# Patient Record
Sex: Male | Born: 1965 | Race: Black or African American | Hispanic: No | Marital: Single | Smoking: Never smoker
Health system: Southern US, Community
[De-identification: ages and names within clinical notes are randomized; demographics above are authoritative.]

---

## 2006-10-10 ENCOUNTER — Emergency Department (HOSPITAL_COMMUNITY): Admission: EM | Admit: 2006-10-10 | Discharge: 2006-10-10 | Payer: Self-pay | Admitting: Emergency Medicine

## 2006-10-14 ENCOUNTER — Emergency Department (HOSPITAL_COMMUNITY): Admission: EM | Admit: 2006-10-14 | Discharge: 2006-10-14 | Payer: Self-pay | Admitting: Emergency Medicine

## 2006-11-28 ENCOUNTER — Ambulatory Visit (HOSPITAL_COMMUNITY): Admission: RE | Admit: 2006-11-28 | Discharge: 2006-11-28 | Payer: Self-pay | Admitting: Gastroenterology

## 2006-11-29 ENCOUNTER — Encounter: Admission: RE | Admit: 2006-11-29 | Discharge: 2006-11-29 | Payer: Self-pay | Admitting: Gastroenterology

## 2006-12-09 ENCOUNTER — Encounter: Admission: RE | Admit: 2006-12-09 | Discharge: 2006-12-09 | Payer: Self-pay | Admitting: Internal Medicine

## 2007-04-27 ENCOUNTER — Encounter: Admission: RE | Admit: 2007-04-27 | Discharge: 2007-04-27 | Payer: Self-pay | Admitting: General Practice

## 2007-05-01 ENCOUNTER — Encounter: Admission: RE | Admit: 2007-05-01 | Discharge: 2007-06-11 | Payer: Self-pay | Admitting: General Practice

## 2009-03-28 IMAGING — CR DG CERVICAL SPINE 2 OR 3 VIEWS
3 series · 3 of 3 positions shown · non-contrast
Comparison: None.

CLINICAL DATA: No injury.  Posterior neck pain.  
CERVICAL SPINE ? 3 VIEW:

[w c-spine lat]
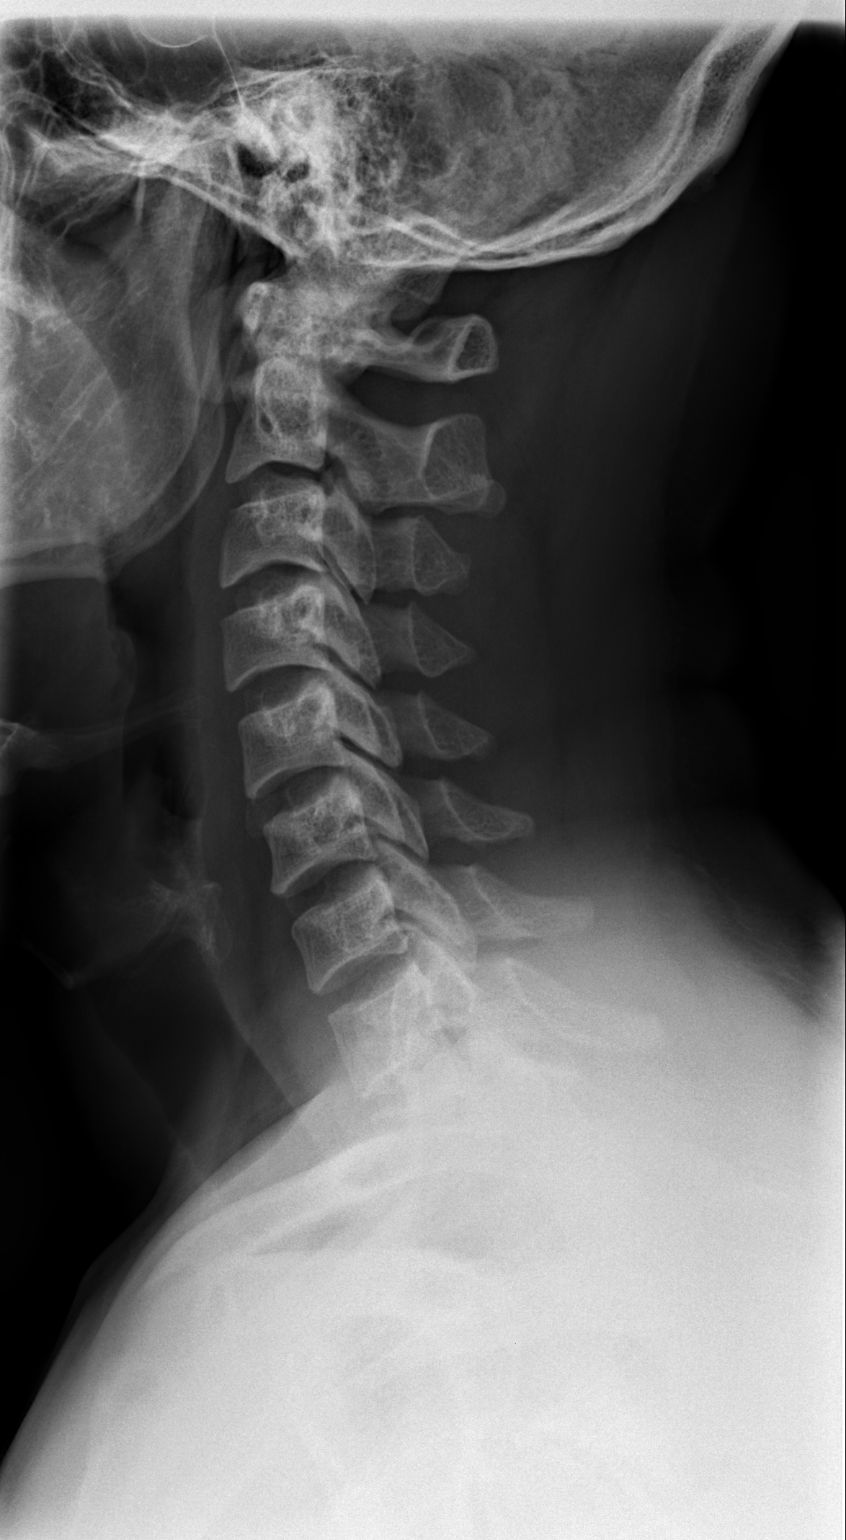

[w c-spine a.p.]
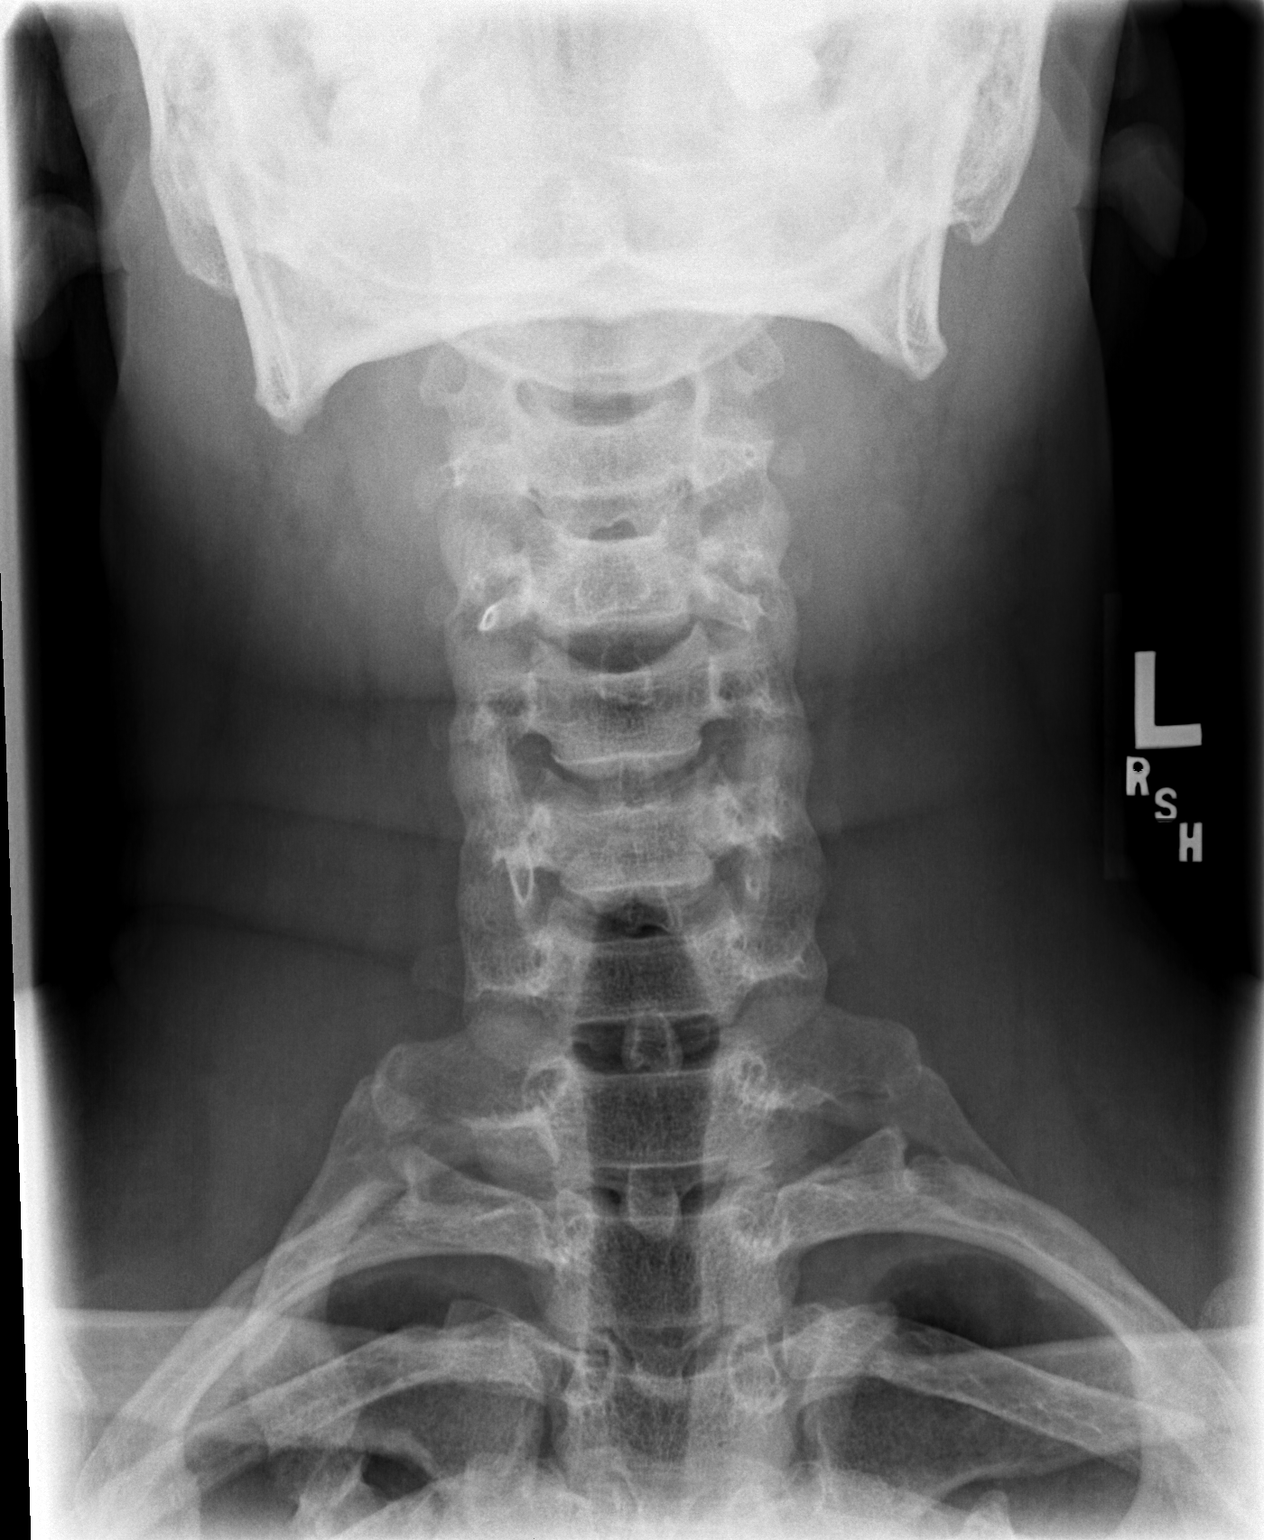

[w c-spine odontoid]
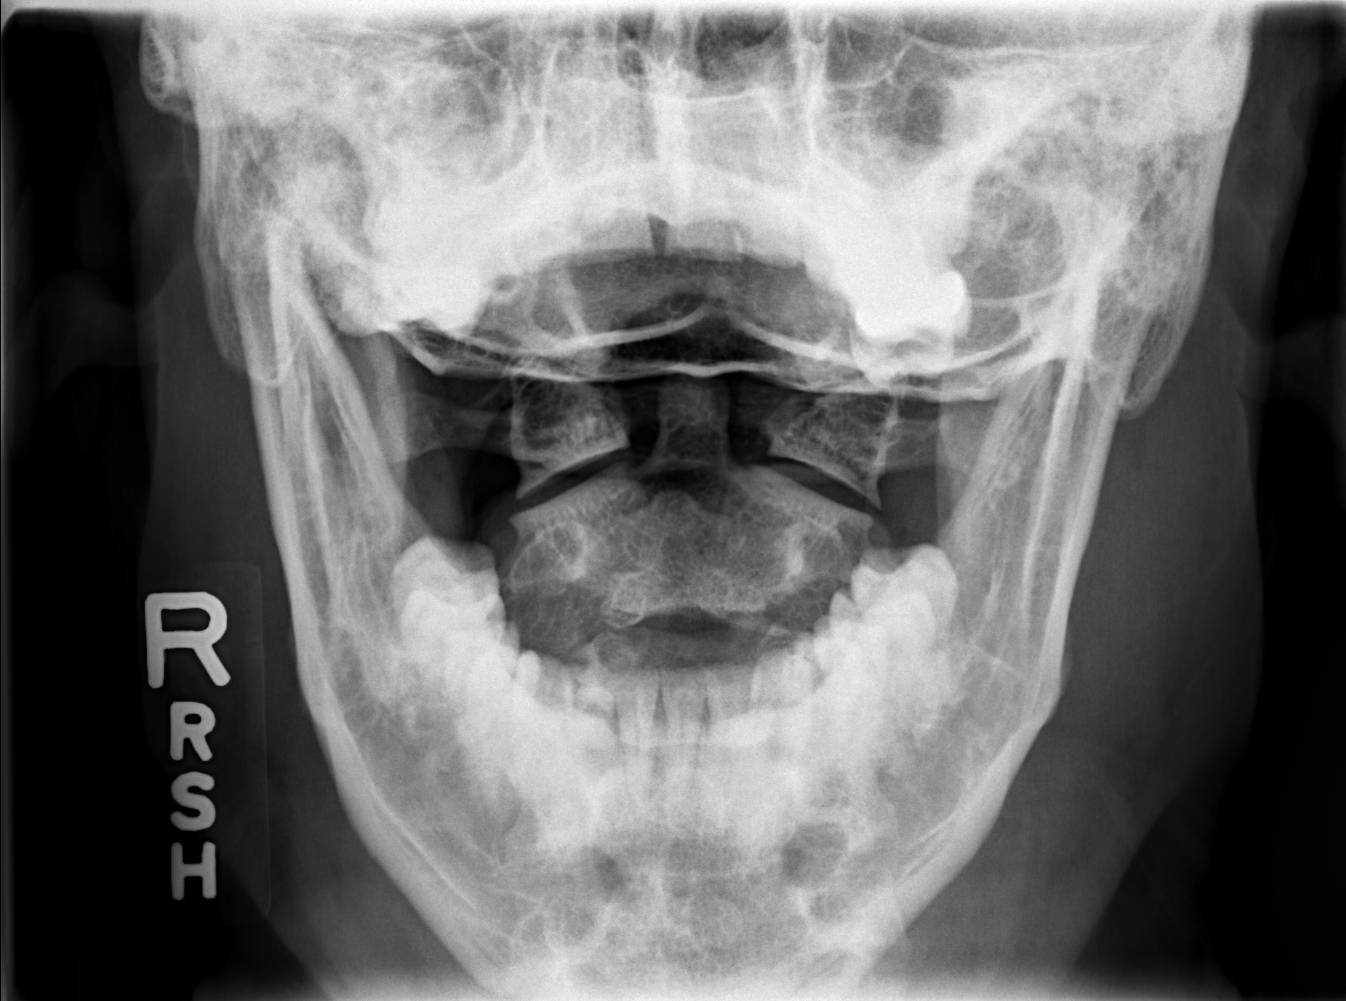

[3 of 3 positions shown; findings below may reference images not displayed]

FINDINGS: Normal alignment without disc space narrowing.  No bony destructive lesion.  Minimal apical pleural thickening.
IMPRESSION: No acute abnormality.

## 2010-01-03 ENCOUNTER — Emergency Department (HOSPITAL_COMMUNITY)
Admission: EM | Admit: 2010-01-03 | Discharge: 2010-01-03 | Payer: Self-pay | Source: Home / Self Care | Admitting: Emergency Medicine

## 2010-03-16 ENCOUNTER — Emergency Department (HOSPITAL_COMMUNITY)
Admission: EM | Admit: 2010-03-16 | Discharge: 2010-03-16 | Disposition: A | Discharge status: Home / Self Care | Payer: Self-pay | Attending: Emergency Medicine | Admitting: Emergency Medicine

## 2010-03-16 ENCOUNTER — Emergency Department (HOSPITAL_COMMUNITY): Payer: Self-pay

## 2010-03-16 DIAGNOSIS — M79609 Pain in unspecified limb: Secondary | ICD-10-CM | POA: Insufficient documentation

## 2010-03-16 DIAGNOSIS — R071 Chest pain on breathing: Secondary | ICD-10-CM | POA: Insufficient documentation

## 2010-03-16 DIAGNOSIS — M25519 Pain in unspecified shoulder: Secondary | ICD-10-CM | POA: Insufficient documentation

## 2010-03-16 LAB — URINALYSIS, ROUTINE W REFLEX MICROSCOPIC
Hgb urine dipstick: NEGATIVE
Ketones, ur: NEGATIVE mg/dL
Protein, ur: NEGATIVE mg/dL
Specific Gravity, Urine: 1.026 (ref 1.005–1.030)
Urine Glucose, Fasting: NEGATIVE mg/dL
Urobilinogen, UA: 0.2 mg/dL (ref 0.0–1.0)
pH: 6 (ref 5.0–8.0)

## 2010-03-16 LAB — CBC
Hemoglobin: 15.4 g/dL (ref 13.0–17.0)
MCH: 28.9 pg (ref 26.0–34.0)
MCV: 84.4 fL (ref 78.0–100.0)
Platelets: 202 10*3/uL (ref 150–400)
RBC: 5.33 MIL/uL (ref 4.22–5.81)
RDW: 12.2 % (ref 11.5–15.5)

## 2010-03-16 LAB — COMPREHENSIVE METABOLIC PANEL
Calcium: 9.7 mg/dL (ref 8.4–10.5)
GFR calc Af Amer: >60 mL/min (ref >60)
Glucose, Bld: 164 mg/dL — ABNORMAL HIGH (ref 70–99)
Potassium: 4 mEq/L (ref 3.5–5.1)
Total Bilirubin: 1 mg/dL (ref 0.3–1.2)

## 2010-03-16 LAB — DIFFERENTIAL
Basophils Relative: 0 % (ref 0–1)
Eosinophils Relative: 1 % (ref 0–5)
Monocytes Absolute: 0.4 10*3/uL (ref 0.1–1.0)

## 2010-03-16 LAB — POCT CARDIAC MARKERS
CKMB, poc: 1.3 ng/mL (ref 1.0–8.0)
Troponin i, poc: <0.05 ng/mL (ref 0.00–0.09)

## 2010-03-18 LAB — URINE CULTURE
Culture  Setup Time: 201202180501
Culture: NO GROWTH

## 2010-04-10 LAB — BASIC METABOLIC PANEL
Calcium: 9.3 mg/dL (ref 8.4–10.5)
GFR calc Af Amer: >60 mL/min (ref >60)
Sodium: 139 mEq/L (ref 135–145)

## 2010-04-10 LAB — CBC
HCT: 43.7 % (ref 39.0–52.0)
MCH: 29.3 pg (ref 26.0–34.0)
MCHC: 35 g/dL (ref 30.0–36.0)
Platelets: 203 10*3/uL (ref 150–400)
RDW: 12.4 % (ref 11.5–15.5)

## 2010-11-08 LAB — DIFFERENTIAL
Basophils Relative: 1
Lymphocytes Relative: 54 — ABNORMAL HIGH
Lymphs Abs: 3.5 — ABNORMAL HIGH
Monocytes Absolute: 0.4
Monocytes Relative: 7
Neutrophils Relative %: 36 — ABNORMAL LOW

## 2010-11-08 LAB — COMPREHENSIVE METABOLIC PANEL
Calcium: 9.2
GFR calc Af Amer: >60
Glucose, Bld: 113 — ABNORMAL HIGH
Potassium: 3.8
Total Bilirubin: 0.6
Total Protein: 6.9

## 2010-11-08 LAB — CBC
HCT: 44.1
MCHC: 34.2
MCV: 86.2
Platelets: 236
RDW: 12.9
WBC: 6.5

## 2010-11-08 LAB — POCT CARDIAC MARKERS
CKMB, poc: 2.1
Myoglobin, poc: 85.7
Troponin i, poc: <0.05

## 2010-11-09 LAB — COMPREHENSIVE METABOLIC PANEL
ALT: 56 — ABNORMAL HIGH
AST: 34
Calcium: 9.4
GFR calc Af Amer: >60
Sodium: 138
Total Protein: 6.7

## 2010-11-09 LAB — CK TOTAL AND CKMB (NOT AT ARMC)
CK, MB: 5.8 — ABNORMAL HIGH
Total CK: 618 — ABNORMAL HIGH

## 2010-11-09 LAB — CBC
Hemoglobin: 15.2
MCHC: 34.5
MCV: 85.7
Platelets: 225
RBC: 5.14
WBC: 7.6

## 2010-11-09 LAB — DIFFERENTIAL
Eosinophils Absolute: 0.2
Eosinophils Relative: 2
Lymphs Abs: 3.6 — ABNORMAL HIGH
Monocytes Absolute: 0.5
Monocytes Relative: 7

## 2011-06-24 ENCOUNTER — Emergency Department (HOSPITAL_COMMUNITY)
Admission: EM | Admit: 2011-06-24 | Discharge: 2011-06-25 | Disposition: A | Discharge status: Home / Self Care | Payer: BC Managed Care – PPO | Attending: Emergency Medicine | Admitting: Emergency Medicine

## 2011-06-24 ENCOUNTER — Encounter (HOSPITAL_COMMUNITY): Payer: Self-pay | Admitting: *Deleted

## 2011-06-24 DIAGNOSIS — R5381 Other malaise: Secondary | ICD-10-CM | POA: Insufficient documentation

## 2011-06-24 DIAGNOSIS — R739 Hyperglycemia, unspecified: Secondary | ICD-10-CM

## 2011-06-24 DIAGNOSIS — R42 Dizziness and giddiness: Secondary | ICD-10-CM

## 2011-06-24 DIAGNOSIS — R7309 Other abnormal glucose: Secondary | ICD-10-CM | POA: Insufficient documentation

## 2011-06-24 NOTE — ED Notes (Signed)
Pt stated that he has been intermittent dizziness and lightheadedness x 3days. Today the dizziness became worse. He stated that he has been running an intermittent fever starting today. No SOB, No CP. He also stated that he has been having a sharp headache intermittently with the dizziness. No changes in vision. No weakness noted. No nausea/vomiting. No sensitivity to light or sound. Will continue to monitor.

## 2011-06-24 NOTE — ED Notes (Addendum)
Patient states weakness and dizziness x 3 days, patient also c/o posterior headache

## 2011-06-24 NOTE — ED Notes (Signed)
EDP at bedside  

## 2011-06-24 NOTE — ED Provider Notes (Addendum)
History     CSN: [id number]  Arrival date & time 06/24/11  2218   First MD Initiated Contact with Patient 06/24/11 2349      Chief Complaint  Patient presents with  . Weakness  . Dizziness    (Consider location/radiation/quality/duration/timing/severity/associated sxs/prior treatment) HPI Complains of weakness in both legs and dizziness i.e. sensation of room spinning accompanied by occipital headache onset 3 days ago. Headache is intermittent lasting one hour at a time. Dull in nature gradual in onset. Accompanying symptoms include intermittent numbness of left arm, and subjective fever. Treated himself with Advil last dose 6 hours ago. Nothing makes symptoms better or worse presently symptoms are mild History reviewed. No pertinent past medical history. Medical history negative History reviewed. No pertinent past surgical history.  No family history on file.  History  Substance Use Topics  . Smoking status: Never Smoker   . Smokeless tobacco: Not on file  . Alcohol Use: No   No alcohol no drugs no tobacco   Review of Systems  Respiratory: Negative.   Cardiovascular: Negative.   Gastrointestinal: Negative.   Musculoskeletal: Negative.   Skin: Negative.   Neurological: Positive for dizziness, weakness, numbness and headaches.  Hematological: Negative.   Psychiatric/Behavioral: Negative.   All other systems reviewed and are negative.    Allergies  Quinine derivatives and Bactrim  Home Medications  No current outpatient prescriptions on file.  BP 143/88  Pulse 77  Temp(Src) 98.7 F (37.1 C) (Oral)  Resp 18  SpO2 100%  Physical Exam  Nursing note and vitals reviewed. Constitutional: He appears well-developed and well-nourished.  HENT:  Head: Normocephalic and atraumatic.  Eyes: Conjunctivae are normal. Pupils are equal, round, and reactive to light.  Neck: Neck supple. No tracheal deviation present. No thyromegaly present.  Cardiovascular: Normal rate  and regular rhythm.   No murmur heard. Pulmonary/Chest: Effort normal and breath sounds normal.  Abdominal: Soft. Bowel sounds are normal. He exhibits no distension. There is no tenderness.  Musculoskeletal: Normal range of motion. He exhibits no edema and no tenderness.  Neurological: He is alert. He displays normal reflexes. Coordination normal.       Gait normal pronator drift normal, finger to nose normal  Skin: Skin is warm and dry. No rash noted.  Psychiatric: He has a normal mood and affect.    ED Course  Procedures (including critical care time) At 7 AM patient is alert ambulatory Glasgow Coma Score 15 and asymptomatic after treatment with meclizine and Ativan Labs Reviewed - No data to display No results found.   No diagnosis found.   Date: 06/25/2011  Rate: 50  Rhythm: sinus bradycardia  QRS Axis: normal  Intervals: normal  ST/T Wave abnormalities: nonspecific T wave changes  Conduction Disutrbances:none  Narrative Interpretation:   Old EKG Reviewed: changes noted Tracing from 03/16/2010 showed normal sinus rhythm 65 beats per minute otherwise unchanged  Results for orders placed during the hospital encounter of 06/24/11  BASIC METABOLIC PANEL      Component Value Range   Sodium 138  135 - 145 (mEq/L)   Potassium 4.4  3.5 - 5.1 (mEq/L)   Chloride 103  96 - 112 (mEq/L)   CO2 27  19 - 32 (mEq/L)   Glucose, Bld 196 (*) 70 - 99 (mg/dL)   BUN 10  6 - 23 (mg/dL)   Creatinine, Ser 4.09  0.50 - 1.35 (mg/dL)   Calcium 9.2  8.4 - 81.1 (mg/dL)   GFR calc non Af Denyse Dago  86 (*) >90 (mL/min)   GFR calc Af Amer >90  >90 (mL/min)  CBC      Component Value Range   WBC 6.9  4.0 - 10.5 (K/uL)   RBC 4.92  4.22 - 5.81 (MIL/uL)   Hemoglobin 14.7  13.0 - 17.0 (g/dL)   HCT 08.6  57.8 - 46.9 (%)   MCV 82.7  78.0 - 100.0 (fL)   MCH 29.9  26.0 - 34.0 (pg)   MCHC 36.1 (*) 30.0 - 36.0 (g/dL)   RDW 62.9  52.8 - 41.3 (%)   Platelets 187  150 - 400 (K/uL)   No results found.  MDM    In light of fact the patient had no focal neurologic findings, MRI was canceled after prolonged discussion with patient and his fear of claustrophobia. Plan followup with Dr.Kpeglo Diagnosis #1 vertigo #2 hyperglycemia        Doug Sou, MD 06/25/11 2440  Doug Sou, MD 06/25/11 1027

## 2011-06-25 ENCOUNTER — Emergency Department (HOSPITAL_COMMUNITY): Payer: BC Managed Care – PPO

## 2011-06-25 LAB — CBC
HCT: 40.7 % (ref 39.0–52.0)
Hemoglobin: 14.7 g/dL (ref 13.0–17.0)
MCH: 29.9 pg (ref 26.0–34.0)
MCHC: 36.1 g/dL — ABNORMAL HIGH (ref 30.0–36.0)

## 2011-06-25 LAB — BASIC METABOLIC PANEL
BUN: 10 mg/dL (ref 6–23)
Chloride: 103 mEq/L (ref 96–112)
GFR calc Af Amer: >90 mL/min (ref >90)
Glucose, Bld: 196 mg/dL — ABNORMAL HIGH (ref 70–99)
Potassium: 4.4 mEq/L (ref 3.5–5.1)

## 2011-06-25 MED ORDER — MECLIZINE HCL 25 MG PO TABS
50.0000 mg | ORAL_TABLET | Freq: Once | ORAL | Status: AC
  Administered 2011-06-25: 50 mg via ORAL
  Filled 2011-06-25: qty 2

## 2011-06-25 MED ORDER — LORAZEPAM 2 MG/ML IJ SOLN
1.0000 mg | Freq: Once | INTRAMUSCULAR | Status: DC
  Filled 2011-06-25: qty 1

## 2011-06-25 NOTE — ED Notes (Signed)
States dizziness and weakness which started 2 days ago. Had near syncopal episode today. Also complaining of headache in occipital lobe. Rates pain as 7/10

## 2011-06-25 NOTE — ED Notes (Signed)
Report given to [facility].  Lana made aware of pts need for EKG.

## 2011-06-25 NOTE — ED Notes (Signed)
Patient refusing MRI. States he wants to do open MRI. States he is claustrophobic. Also refused Ativan. MD notified

## 2011-06-25 NOTE — Discharge Instructions (Signed)
Diabetes, Frequently Asked Questions WHAT IS DIABETES? Most of the food we eat is turned into glucose (sugar). Our bodies use it for energy. The pancreas makes a hormone called insulin. It helps glucose get into the cells of our bodies. When you have diabetes, your body either does not make enough insulin or cannot use its own insulin as well as it should. This causes sugars to build up in your blood. WHAT ARE THE SYMPTOMS OF DIABETES?  Frequent urination.   Excessive thirst.   Unexplained weight loss.   Extreme hunger.   Blurred vision.   Tingling or numbness in hands or feet.   Feeling very tired much of the time.   Dry, itchy skin.   Sores that are slow to heal.   Yeast infections.  WHAT ARE THE TYPES OF DIABETES? Type 1 Diabetes   About 10% of affected people have this type.   Usually occurs before the age of 86.   Usually occurs in thin to normal weight people.  Type 2 Diabetes  About 90% of affected people have this type.   Usually occurs after the age of 74.   Usually occurs in overweight people.   More likely to have:   A family history of diabetes.   A history of diabetes during pregnancy (gestational diabetes).   High blood pressure.   High cholesterol and triglycerides.  Gestational Diabetes  Occurs in about 4% of pregnancies.   Usually goes away after the baby is born.   More likely to occur in women with:   Family history of diabetes.   Previous gestational diabetes.   Obese.   Over 46 years old.  WHAT IS PRE-DIABETES? Pre-diabetes means your blood glucose is higher than normal, but lower than the diabetes range. It also means you are at risk of getting type 2 diabetes and heart disease. If you are told you have pre-diabetes, have your blood glucose checked again in 1 to 2 years. WHAT IS THE TREATMENT FOR DIABETES? Treatment is aimed at keeping blood glucose near normal levels at all times. Learning how to manage this yourself is  important in treating diabetes. Depending on the type of diabetes you have, your treatment will include one or more of the following:  Monitoring your blood glucose.   Meal planning.   Exercise.   Oral medicine (pills) or insulin.  CAN DIABETES BE PREVENTED? With type 1 diabetes, prevention is more difficult, because the triggers that cause it are not yet known. With type 2 diabetes, prevention is more likely, with lifestyle changes:  Maintain a healthy weight.   Eat healthy.   Exercise.  IS THERE A CURE FOR DIABETES? No, there is no cure for diabetes. There is a lot of research going on that is looking for a cure, and progress is being made. Diabetes can be treated and controlled. People with diabetes can manage their diabetes and lead normal, active lives. SHOULD I BE TESTED FOR DIABETES? If you are at least 45 years old, you should be tested for diabetes. You should be tested again every 3 years. If you are 31 or older and overweight, you may want to get tested more often. If you are younger than 79, overweight, and have one or more of the following risk factors, you should be tested:  Family history of diabetes.   Inactive lifestyle.   High blood pressure.  WHAT ARE SOME OTHER SOURCES FOR INFORMATION ON DIABETES? The following organizations may help in your search for  more information on diabetes: National Diabetes Education Program (NDEP) Internet: SolarDiscussions.es American Diabetes Association Internet: http://www.diabetes.org  Juvenile Diabetes Foundation International Internet: [employer] Document Released: 01/17/2003 Document Revised: 01/03/2011 Document Reviewed: 11/11/2008 [facility] Patient Information 2012 [place], [place].Diabetes, Type 2, Am I At Risk? Diabetes is a lasting (chronic) disease. In type 2 diabetes, the pancreas does not make enough insulin, and the body does not respond normally to the insulin that is made. This type of diabetes  was also previously called adult onset diabetes. About 90% of all those who have diabetes have type 2. It usually occurs after the age of 90, but can occur at any age.  People develop type 2 diabetes because they do not use insulin properly. Eventually, the pancreas cannot make enough insulin for the body's needs. Over time, the amount of glucose (sugar) in the blood increases. RISK FACTORS  Overweight - the more weight you have, the more resistant your cells become to insulin.   Family history - you are more likely to get diabetes if a parent or sibling has diabetes.   Race - certain races get diabetes more.   African Americans.   American Indians.   Asian Americans.   Hispanics.   Pacific Islander.   Inactive - exercise helps control weight and helps your cells be more sensitive to insulin.   Gestational diabetes - some women develop diabetes while they are pregnant. This goes away when they deliver. However, they are 50-60% more likely to develop type 2 diabetes at a later time.   Having a baby over 9 pounds - a sign that you may have had gestational diabetes.   Age - the risk of diabetes goes up as you get older, especially after age 10.   High blood pressure (hypertension).  SYMPTOMS Many people have no signs or symptoms. Symptoms can be so mild that you might not even notice them. Some of these signs are:  Increased thirst.   Increased hunger.   Tiredness (fatigue).   Increased urination, especially at night.   Weight loss.   Blurred vision.   Sores that do not heal.  WHO SHOULD BE TESTED?  Anyone 45 years or older, especially if overweight, should consider getting tested.   If you are younger than 45, overweight, and have one or more of the risk factors, you should consider getting tested.  DIAGNOSIS  Fasting blood glucose (FBS). Usually, 2 are done.   FBS 101-125 mg/dl is considered pre-diabetes.   FBS 126 mg/dl or greater is considered diabetes.   2  hour Oral Glucose Tolerance Test (OGTT). This test is preformed by first having you not eat or drink for several hours. You are then given something sweet to drink and your blood glucose is measured fasting, at one hour and 2 hours. This test tells how well you are able to handle sugars or carbohydrates.   Fasting: 60-100 mg/dl.   1 hour: less than 200 mg/dl.   2 hours: less than 140 mg/dl.   A1c -A1c is a blood glucose test that gives and average of your blood glucose over 3 months. It is the accepted method to use to diagnose diabetes.   A1c 5.7-6.4% is considered pre-diabetes.   A1c 6.5% or greater is considered diabetes.  WHAT DOES IT MEAN TO HAVE PRE-DIABETES? Pre-diabetes means you are at risk for getting type 2 diabetes. Your blood glucose is higher than normal, but not yet high enough to diagnose diabetes. The good news is, if you have  pre-diabetes you can reduce the risk of getting diabetes and even return to normal blood glucose levels. With modest weight loss and moderate physical activity, you can delay or prevent type 2 diabetes.  PREVENTION You cannot do anything about race, age or family history, but you can lower your chances of getting diabetes. You can:   Exercise regularly and be active.   Reduce fat and calorie intake.   Make wise food choices as much as you can.   Reduce your intake of salt and alcohol.   Maintain a reasonable weight.   Keep blood pressure in an acceptable range. Take medication if needed.   Not smoke.   Maintain an acceptable cholesterol level (HDL, LDL, Triglycerides). Take medication if needed.  DOING MY PART: GETTING STARTED Making big changes in your life is hard, especially if you are faced with more than one change. You can make it easier by taking these steps:  Make a plan to change behavior.   Decide exactly what you will do and when you will do it.   Plan what you need to get ready.   Think about what might prevent you from  reaching your goals.   Find family and friends who will support and encourage you.   Decide how you will reward yourself when you do what you have planned.   Your doctor, dietitian, or counselor can help you make a plan.  HERE ARE SOME OF THE AREAS YOU MAY WISH TO CHANGE TO REDUCE YOUR RISK OF DIABETES. If you are overweight or obese, choose sensible ways to get in shape. Even small amounts of weight loss, like 5-10 pounds, can help reduce the effects of insulin resistance and help blood glucose control. Diet  Avoid crash diets. Instead, eat less of the foods you usually have. Limit the amount of fat you eat.   Increase your physical activity. Aim for at least 30 minutes of exercise most days of the week.   Set a reasonable weight-loss goal, such as losing 1 pound a week. Aim for a long-term goal of losing 5-7% of your total body weight.   Make wise food choices most of the time.   What you eat has a big impact on your health. By making wise food choices, you can help control your body weight, blood pressure, and cholesterol.   Take a hard look at the serving sizes of the foods you eat. Reduce serving sizes of meat, desserts, and foods high in fat. Increase your intake of fruits and vegetables.   Limit your fat intake to about 25% of your total calories. For example, if your food choices add up to about 2,000 calories a day, try to eat no more than 56 grams of fat. Your caregiver or a dietitian can help you figure out how much fat to have. You can check food labels for fat content too.   You may also want to reduce the number of calories you have each day.   Keep a food log. Write down what you eat, how much you eat, and anything else that helps keep you on track.   When you meet your goal, reward yourself with a nonfood item or activity.  Exercise  Be physically active every day.   Keep and exercise log. Write down what exercise you did, for how long, and anything else that keeps  you on track.   Regular exercise (like brisk walking) tackles several risk factors at once. It helps you lose weight,  it keeps your cholesterol and blood pressure under control, and it helps your body use insulin. People who are physically active for 30 minutes a day, 5 days a week, reduced their risk of type 2 diabetes. If you are not very active, you should start slowly at first. Talk with your caregiver first about what kinds of exercise would be safe for you. Make a plan to increase your activity level with the goal of being active for at least 30 minutes a day, most days of the week.   Choose activities you enjoy. Here are some ways to work extra activity into your daily routine:   Take the stairs rather than an elevator or escalator.   Park at the far end of the lot and walk.   Get off the bus a few stops early and walk the rest of the way.   Walk or bicycle instead of drive whenever you can.  Medications Some people need medication to help control their blood pressure or cholesterol levels. If you do, take your medicines as directed. Ask your caregiver whether there are any medicines you can take to prevent type 2 diabetes. Document Released: 01/17/2003 Document Revised: 01/03/2011 Document Reviewed: 10/12/2008 [facility] Patient Information 2012 [place], [place].

## 2012-09-18 ENCOUNTER — Encounter: Payer: BC Managed Care – PPO | Admitting: Internal Medicine

## 2016-05-28 ENCOUNTER — Emergency Department (HOSPITAL_COMMUNITY)
Admission: EM | Admit: 2016-05-28 | Discharge: 2016-05-29 | Disposition: A | Discharge status: Home / Self Care | Payer: 59 | Attending: Emergency Medicine | Admitting: Emergency Medicine

## 2016-05-28 ENCOUNTER — Emergency Department (HOSPITAL_COMMUNITY): Payer: 59

## 2016-05-28 ENCOUNTER — Encounter (HOSPITAL_COMMUNITY): Payer: Self-pay | Admitting: Emergency Medicine

## 2016-05-28 DIAGNOSIS — K2970 Gastritis, unspecified, without bleeding: Secondary | ICD-10-CM | POA: Insufficient documentation

## 2016-05-28 DIAGNOSIS — R079 Chest pain, unspecified: Secondary | ICD-10-CM | POA: Diagnosis not present

## 2016-05-28 DIAGNOSIS — R739 Hyperglycemia, unspecified: Secondary | ICD-10-CM | POA: Diagnosis not present

## 2016-05-28 LAB — CBC
HEMATOCRIT: 45.2 % (ref 39.0–52.0)
Hemoglobin: 15.6 g/dL (ref 13.0–17.0)
MCH: 28.9 pg (ref 26.0–34.0)
MCHC: 34.5 g/dL (ref 30.0–36.0)
MCV: 83.9 fL (ref 78.0–100.0)
PLATELETS: 225 10*3/uL (ref 150–400)
RBC: 5.39 MIL/uL (ref 4.22–5.81)
RDW: 12.6 % (ref 11.5–15.5)
WBC: 6.2 10*3/uL (ref 4.0–10.5)

## 2016-05-28 LAB — BASIC METABOLIC PANEL
Anion gap: 9 (ref 5–15)
BUN: 10 mg/dL (ref 6–20)
CO2: 24 mmol/L (ref 22–32)
Calcium: 9.2 mg/dL (ref 8.9–10.3)
Chloride: 98 mmol/L — ABNORMAL LOW (ref 101–111)
Creatinine, Ser: 1.19 mg/dL (ref 0.61–1.24)
GFR calc Af Amer: >60 mL/min (ref >60)
Glucose, Bld: 416 mg/dL — ABNORMAL HIGH (ref 65–99)
POTASSIUM: 4.4 mmol/L (ref 3.5–5.1)
SODIUM: 131 mmol/L — AB (ref 135–145)

## 2016-05-28 LAB — I-STAT TROPONIN, ED: Troponin i, poc: 0 ng/mL (ref 0.00–0.08)

## 2016-05-28 NOTE — ED Triage Notes (Signed)
Pt presents to ED for assessment of centralized chest pain, intermittent x 2 weeks but worsening and becoming more constant tonight.  Pt states it radiates to his right shoulder blade.  Denies SOB, denies n/v/d, denies dizziness.

## 2016-05-28 NOTE — ED Provider Notes (Signed)
[facility]-EMERGENCY DEPT Provider Note   CSN: [id number] Arrival date & time: 05/28/16  2142     History   Chief Complaint Chief Complaint  Patient presents with  . Chest Pain    HPI Erik Jacobs is a 51 y.o. male.  HPI  51 y.o. male, presents to the Emergency Department today complaining of centralized chest pain that has been intermittent x 2 weeks. Notes more constant this evening. States that the pain radiates into back. No shortness of breath. No N/V/D. Notes pain is mainly in epigastric region and LUQ. Notes mild discomfort currently. Rates pain 6/10. Burning/aching sensation. Notes no hx ACS. No FH. No hx DVT/PE. No recent surgeries. No recent travel. No other symptoms noted.   History reviewed. No pertinent past medical history.  There are no active problems to display for this patient.   History reviewed. No pertinent surgical history.     Home Medications    Prior to Admission medications   Not on File    Family History History reviewed. No pertinent family history.  Social History Social History  Substance Use Topics  . Smoking status: Never Smoker  . Smokeless tobacco: Never Used  . Alcohol use No     Allergies   Ibuprofen; Quinine derivatives; and Bactrim [sulfamethoxazole-trimethoprim]   Review of Systems Review of Systems ROS reviewed and all are negative for acute change except as noted in the HPI.  Physical Exam Updated Vital Signs BP 136/88 (BP Location: Right Arm)   Pulse 88   Temp 98.7 F (37.1 C) (Oral)   Resp 18   SpO2 98%   Physical Exam  Constitutional: He is oriented to person, place, and time. Vital signs are normal. He appears well-developed and well-nourished.  HENT:  Head: Normocephalic and atraumatic.  Right Ear: Hearing normal.  Left Ear: Hearing normal.  Eyes: Conjunctivae and EOM are normal. Pupils are equal, round, and reactive to light.  Neck: Normal range of motion. Neck supple.  Cardiovascular: Normal rate,  regular rhythm, normal heart sounds and intact distal pulses.   Pulmonary/Chest: Effort normal and breath sounds normal.  Abdominal: Soft. There is no tenderness.  Musculoskeletal: Normal range of motion.  Neurological: He is alert and oriented to person, place, and time.  Skin: Skin is warm and dry.  Psychiatric: He has a normal mood and affect. His speech is normal and behavior is normal. Thought content normal.  Nursing note and vitals reviewed.  ED Treatments / Results  Labs (all labs ordered are listed, but only abnormal results are displayed) Labs Reviewed  BASIC METABOLIC PANEL - Abnormal; Notable for the following:       Result Value   Sodium 131 (*)    Chloride 98 (*)    Glucose, Bld 416 (*)    All other components within normal limits  CBC  I-STAT TROPOININ, ED    EKG  EKG Interpretation None       Radiology Dg Chest 2 View  Result Date: 05/28/2016 CLINICAL DATA:  Left-sided chest and back pain EXAM: CHEST  2 VIEW COMPARISON:  03/16/2010 FINDINGS: The heart size and mediastinal contours are within normal limits. Both lungs are clear. The visualized skeletal structures are unremarkable. IMPRESSION: No active cardiopulmonary disease. Electronically Signed   By: Tollie Eth M.D.   On: 05/28/2016 22:24    Procedures Procedures (including critical care time)  Medications Ordered in ED Medications  gi cocktail (Maalox,Lidocaine,Donnatal) (not administered)     Initial Impression / Assessment and Plan /  ED Course  I have reviewed the triage vital signs and the nursing notes.  Pertinent labs & imaging results that were available during my care of the patient were reviewed by me and considered in my medical decision making (see chart for details).  Final Clinical Impressions(s) / ED Diagnoses  {I have reviewed and evaluated the relevant laboratory values. {I have reviewed and evaluated the relevant imaging studies. {I have interpreted the relevant EKG. {I have  reviewed the relevant previous healthcare records.  {I obtained HPI from historian.   ED Course:  Assessment: Pt is a 51 y.o. male presents with CP/Epigastric pain x 2 weeks . Intermittent. Notes No N/V. No diaphoresis. No fevers. No hx ACS. No FH. Given Gi Coktail in ED with relief. Patient is to be discharged with recommendation to follow up with PCP in regards to today's hospital visit. Chest pain is not likely of cardiac or pulmonary etiology d/t presentation, perc negative, VSS, no tracheal deviation, no JVD or new murmur, RRR, breath sounds equal bilaterally, EKG without acute abnormalities, negative troponin, and negative CXR. Likely gastritis vs reflux. Pain improved in ED with GI cocktail. Also noted hyperglycemia in ED. NO gap. Counseled patient on diet control as he wishes t o not take medications. Pt has been advised start a PPI and return to the ED is CP becomes exertional, associated with diaphoresis or nausea, radiates to left jaw/arm, worsens or becomes concerning in any way. Pt appears reliable for follow up and is agreeable to discharge. Patient is in no acute distress. Vital Signs are stable. Patient is able to ambulate. Patient able to tolerate PO.   Disposition/Plan:  DC Home Additional Verbal discharge instructions given and discussed with patient.  Pt Instructed to f/u with PCP in the next week for evaluation and treatment of symptoms. Return precautions given Pt acknowledges and agrees with plan  Supervising Physician April Palumbo, MD  Final diagnoses:  Chest pain, unspecified type  Gastritis without bleeding, unspecified chronicity, unspecified gastritis type  Hyperglycemia    New Prescriptions New Prescriptions   No medications on file     Audry Pili, PA-C 05/29/16 [id number]    April Palumbo, MD 05/29/16 0300

## 2016-05-29 MED ORDER — GI COCKTAIL ~~LOC~~
30.0000 mL | Freq: Once | ORAL | Status: AC
  Administered 2016-05-29: 30 mL via ORAL
  Filled 2016-05-29: qty 30

## 2016-05-29 MED ORDER — PANTOPRAZOLE SODIUM 20 MG PO TBEC: 20.0000 mg | DELAYED_RELEASE_TABLET | Freq: Every day | ORAL | 0 refills | Status: AC

## 2016-05-29 NOTE — Discharge Instructions (Signed)
Please read and follow all provided instructions.  Your diagnoses today include:  1. Chest pain, unspecified type   2. Gastritis without bleeding, unspecified chronicity, unspecified gastritis type     Tests performed today include: An EKG of your heart A chest x-ray Cardiac enzymes - a blood test for heart muscle damage Blood counts and electrolytes Vital signs. See below for your results today.   Medications prescribed:   Take any prescribed medications only as directed.  Follow-up instructions: Please follow-up with your primary care provider as soon as you can for further evaluation of your symptoms.   Return instructions:  SEEK IMMEDIATE MEDICAL ATTENTION IF: You have severe chest pain, especially if the pain is crushing or pressure-like and spreads to the arms, back, neck, or jaw, or if you have sweating, nausea (feeling sick to your stomach), or shortness of breath. THIS IS AN EMERGENCY. Don't wait to see if the pain will go away. Get medical help at once. Call 911 or [phone] (operator). DO NOT drive yourself to the hospital.  Your chest pain gets worse and does not go away with rest.  You have an attack of chest pain lasting longer than usual, despite rest and treatment with the medications your caregiver has prescribed.  You wake from sleep with chest pain or shortness of breath. You feel dizzy or faint. You have chest pain not typical of your usual pain for which you originally saw your caregiver.  You have any other emergent concerns regarding your health.  Additional Information: Chest pain comes from many different causes. Your caregiver has diagnosed you as having chest pain that is not specific for one problem, but does not require admission.  You are at low risk for an acute heart condition or other serious illness.   Your vital signs today were: BP 136/88 (BP Location: Right Arm)    Pulse 88    Temp 98.7 F (37.1 C) (Oral)    Resp 18    SpO2 98%  If your blood pressure  (BP) was elevated above 135/85 this visit, please have this repeated by your doctor within one month. --------------

## 2016-05-29 NOTE — ED Notes (Signed)
Audry Pili, PA at bedside at this time.

## 2017-07-11 ENCOUNTER — Emergency Department (HOSPITAL_COMMUNITY)
Admission: EM | Admit: 2017-07-11 | Discharge: 2017-07-12 | Disposition: A | Discharge status: Home / Self Care | Payer: 59 | Attending: Emergency Medicine | Admitting: Emergency Medicine

## 2017-07-11 ENCOUNTER — Other Ambulatory Visit: Payer: Self-pay

## 2017-07-11 DIAGNOSIS — R079 Chest pain, unspecified: Secondary | ICD-10-CM | POA: Insufficient documentation

## 2017-07-11 DIAGNOSIS — R42 Dizziness and giddiness: Secondary | ICD-10-CM

## 2017-07-12 ENCOUNTER — Emergency Department (HOSPITAL_COMMUNITY): Payer: 59

## 2017-07-12 ENCOUNTER — Other Ambulatory Visit: Payer: Self-pay

## 2017-07-12 ENCOUNTER — Encounter (HOSPITAL_COMMUNITY): Payer: Self-pay | Admitting: Emergency Medicine

## 2017-07-12 LAB — BASIC METABOLIC PANEL
ANION GAP: 10 (ref 5–15)
BUN: 11 mg/dL (ref 6–20)
CHLORIDE: 99 mmol/L — AB (ref 101–111)
CO2: 26 mmol/L (ref 22–32)
Calcium: 9.5 mg/dL (ref 8.9–10.3)
Creatinine, Ser: 0.79 mg/dL (ref 0.61–1.24)
Glucose, Bld: 350 mg/dL — ABNORMAL HIGH (ref 65–99)
POTASSIUM: 4.1 mmol/L (ref 3.5–5.1)
SODIUM: 135 mmol/L (ref 135–145)

## 2017-07-12 LAB — I-STAT TROPONIN, ED
TROPONIN I, POC: 0 ng/mL (ref 0.00–0.08)
Troponin i, poc: 0 ng/mL (ref 0.00–0.08)

## 2017-07-12 LAB — CBC
HEMATOCRIT: 46 % (ref 39.0–52.0)
HEMOGLOBIN: 15.5 g/dL (ref 13.0–17.0)
MCH: 28 pg (ref 26.0–34.0)
MCHC: 33.7 g/dL (ref 30.0–36.0)
MCV: 83.2 fL (ref 78.0–100.0)
Platelets: 240 10*3/uL (ref 150–400)
RBC: 5.53 MIL/uL (ref 4.22–5.81)
RDW: 11.9 % (ref 11.5–15.5)
WBC: 6.9 10*3/uL (ref 4.0–10.5)

## 2017-07-12 MED ORDER — FENTANYL CITRATE (PF) 100 MCG/2ML IJ SOLN
50.0000 ug | Freq: Once | INTRAMUSCULAR | Status: AC
  Administered 2017-07-12: 50 ug via INTRAVENOUS
  Filled 2017-07-12: qty 2

## 2017-07-12 MED ORDER — SODIUM CHLORIDE 0.9 % IV BOLUS
1000.0000 mL | Freq: Once | INTRAVENOUS | Status: AC
  Administered 2017-07-12: 1000 mL via INTRAVENOUS

## 2017-07-12 MED ORDER — MECLIZINE HCL 25 MG PO TABS
25.0000 mg | ORAL_TABLET | Freq: Once | ORAL | Status: AC
  Administered 2017-07-12: 25 mg via ORAL
  Filled 2017-07-12: qty 1

## 2017-07-12 MED ORDER — MECLIZINE HCL 25 MG PO TABS: 25.0000 mg | ORAL_TABLET | Freq: Three times a day (TID) | ORAL | 0 refills | Status: AC | PRN

## 2017-07-12 NOTE — ED Provider Notes (Signed)
[facility] EMERGENCY DEPARTMENT Provider Note   CSN: [id number] Arrival date & time: 07/11/17  2334     History   Chief Complaint Chief Complaint  Patient presents with  . Chest Pain    HPI Erik Jacobs is a 52 y.o. male Erik Jacobs for evaluation of intermittent left-sided chest pain that began approximate 4 PM yesterday evening.  Patient reports that he was sitting in his living room when he started having some intermittent left-sided chest pain that he described as a sharp pain.  Patient states he did not get short of breath, diaphoretic, nauseous with this pain.  He states that it was not worse with exertion.  He states that it is mildly worse with deep inspiration.  But he did not have any shortness of breath.  Patient reports that several hours later, he started having some generalized weakness and felt dizzy.  He states that like the room was spinning that is worse with walking.  He denies any focal weakness.  Patient reports he has been in his normal state of health prior to onset of symptoms.  He states he does not have any history of heart issues and denies any personal family cardiac history. He denies any exogenous hormone use, recent immobilization, prior history of DVT/PE, recent surgery, leg swelling, or long travel.  She is not a current smoker denies any cocaine use.  Patient denies any fever, difficulty breathing, abdominal pain, nausea/vomiting, vision changes, speech difficulty, leg swelling.   The history is provided by the patient.    History reviewed. No pertinent past medical history.  There are no active problems to display for this patient.   History reviewed. No pertinent surgical history.      Home Medications    Prior to Admission medications   Medication Sig Start Date End Date Taking? Authorizing Provider  meclizine (ANTIVERT) 25 MG tablet Take 1 tablet (25 mg total) by mouth 3 (three) times daily as needed for dizziness. 07/12/17    Maxwell Caul, PA-C  pantoprazole (PROTONIX) 20 MG tablet Take 1 tablet (20 mg total) by mouth daily. Patient not taking: Reported on 07/12/2017 05/29/16   Audry Pili, PA-C    Family History No family history on file.  Social History Social History   Tobacco Use  . Smoking status: Never Smoker  . Smokeless tobacco: Never Used  Substance Use Topics  . Alcohol use: No  . Drug use: No     Allergies   Ibuprofen; Quinine derivatives; and Bactrim [sulfamethoxazole-trimethoprim]   Review of Systems Review of Systems  Constitutional: Negative for chills and fever.  HENT: Negative for congestion.   Eyes: Negative for visual disturbance.  Respiratory: Negative for cough and shortness of breath.   Cardiovascular: Positive for chest pain.  Gastrointestinal: Negative for abdominal pain, diarrhea, nausea and vomiting.  Genitourinary: Negative for dysuria and hematuria.  Musculoskeletal: Negative for back pain and neck pain.  Skin: Negative for rash.  Neurological: Positive for dizziness and light-headedness. Negative for weakness, numbness and headaches.  Psychiatric/Behavioral: Negative for confusion.  All other systems reviewed and are negative.    Physical Exam Updated Vital Signs BP (!) 135/99 (BP Location: Right Arm)   Pulse (!) 44   Temp 98.8 F (37.1 C) (Oral)   Resp 13   Ht 5\' 9"  (1.753 m)   Wt 78 kg (172 lb)   SpO2 100%   BMI 25.40 kg/m   Physical Exam  Constitutional: He is oriented to person, place,  and time. He appears well-developed and well-nourished.  HENT:  Head: Normocephalic and atraumatic.  Mouth/Throat: Oropharynx is clear and moist and mucous membranes are normal.  Eyes: Pupils are equal, round, and reactive to light. Conjunctivae and lids are normal. Right eye exhibits nystagmus. Left eye exhibits nystagmus.  Slight horizontal nystagmus noted to the right.  No vertical, rotational nystagmus.  Neck: Full passive range of motion without pain.    Cardiovascular: Normal rate, regular rhythm, normal heart sounds and normal pulses. Exam reveals no gallop and no friction rub.  No murmur heard. Pulmonary/Chest: Effort normal and breath sounds normal.  Lungs clear to auscultation bilaterally.  Symmetric chest rise.  No wheezing, rales, rhonchi.   Abdominal: Soft. Normal appearance. There is no tenderness. There is no rigidity and no guarding.  Musculoskeletal: Normal range of motion.  Neurological: He is alert and oriented to person, place, and time.  Cranial nerves III-XII intact Follows commands, Moves all extremities  5/5 strength to BUE and BLE  Sensation intact throughout all major nerve distributions Normal finger to nose. No dysdiadochokinesia. No pronator drift. No gait abnormalities  No slurred speech. No facial droop.   Skin: Skin is warm and dry. Capillary refill takes less than 2 seconds.  Psychiatric: He has a normal mood and affect. His speech is normal.  Nursing note and vitals reviewed.    ED Treatments / Results  Labs (all labs ordered are listed, but only abnormal results are displayed) Labs Reviewed  BASIC METABOLIC PANEL - Abnormal; Notable for the following components:      Result Value   Chloride 99 (*)    Glucose, Bld 350 (*)    All other components within normal limits  CBC  I-STAT TROPONIN, ED  I-STAT TROPONIN, ED    EKG EKG Interpretation  Date/Time:  Saturday July 12 2017 10:35:55 EDT Ventricular Rate:  49 PR Interval:  180 QRS Duration: 94 QT Interval:  411 QTC Calculation: 371 R Axis:   77 Text Interpretation:  Sinus bradycardia Confirmed by Cathren Laine ([id number]) on 07/12/2017 10:44:24 AM Also confirmed by Cathren Laine ([id number]), editor Josephine Igo [phone])  on 07/12/2017 10:50:55 AM   Radiology Dg Chest 2 View  Result Date: 07/12/2017 CLINICAL DATA:  Intermittent LEFT chest pain and lightheadedness. EXAM: CHEST - 2 VIEW COMPARISON:  Chest radiograph May 28, 2016 FINDINGS:  Cardiomediastinal silhouette is normal. No pleural effusions or focal consolidations. Punctate granuloma RIGHT lung base. Mild bronchitic changes. Trachea projects midline and there is no pneumothorax. Soft tissue planes and included osseous structures are non-suspicious. IMPRESSION: Mild bronchitic changes, no focal consolidation. Electronically Signed   By: Awilda Metro M.D.   On: 07/12/2017 01:11    Procedures Procedures (including critical care time)  Medications Ordered in ED Medications  sodium chloride 0.9 % bolus 1,000 mL (0 mLs Intravenous Stopped 07/12/17 0943)  meclizine (ANTIVERT) tablet 25 mg (25 mg Oral Given 07/12/17 0717)  fentaNYL (SUBLIMAZE) injection 50 mcg (50 mcg Intravenous Given 07/12/17 0723)     Initial Impression / Assessment and Plan / ED Course  I have reviewed the triage vital signs and the nursing notes.  Pertinent labs & imaging results that were available during my care of the patient were reviewed by me and considered in my medical decision making (see chart for details).     52 y.o. Judie Jacobs resents for evaluation of intermittent left sided chest pain that began at 4 PM yesterday.  Also reports some dizziness, lightheadedness, generalized weakness.  No  difficulty breathing, fevers, nausea/vomiting.  No personal cardiac history.  No family cardiac history.  No smoking, cocaine use. Patient is afebrile, non-toxic appearing, sitting comfortably on examination table. Vital signs reviewed and stable. No neuro deficits noted on exam.  Consider  ACS etiology etiology versus electrolyte balance.  Consider vertigo versus orthostatic hypotension versus dehydration. History/physical exam is not concerning for CVA. Initial labs ordered at triage.  Plan for meclizine, fluid hydration.  Initial troponin negative.  BMP shows glucose of 350.  Bicarb is 26.  Anion gap is 10.  CBC shows no significant leukocytosis, anemia.  Chest x-ray shows some mild bronchitic changes but no focal  consolidation.  Given patient's risk factors, history/physical exam, he has a heart score of 3.  We will plan for delta troponin.  Repeat troponin negative.  Discussed results with patient.  He reports improvement in chest pain after analgesics here in the ED.  Fluids are running.  Will plan to ambulate after fluids.  Patient reports he has been told that his blood sugars been elevated before.  Last time he was in the ED, he had elevated blood sugar with no evidence of DKA.  Patient was instructed to follow-up with primary care doctor for further evaluation.  Patient states he never followed up with anybody.  Today, he has a bicarb of 26 and got anion gap of 10.  Exam is not concerning for DKA.  Instructed patient that he will need to follow-up with outpatient primary care for evaluation of his blood sugars.  Patient ambulated in the department without any difficulty.  He reports improvement in symptoms after meclizine and fluids.  On my evaluation, he has normal gait.  Negative Romberg.  Exam is not concerning for CVA, intracranial abnormality.  Suspect symptoms are related to vertigo.  Patient initially had been with a heart rate of 60 here in the ED.  I reviewed his previous visits and show that he is only slightly bradycardic.  His heart rate had dropped down to approximate 48.  Repeat EKG showed no abnormalities.  Do not suspect heart block at this time.  Additionally, given his atypical story, 2- delta troponins, patient stable for discharge at this time.  At this time, no emergent life-threatening condition identified.  Plan for outpatient cardiology referral for patient to follow-up with.  Additionally, patient given outpatient [facility] referral for primary care establishment. Patient had ample opportunity for questions and discussion. All patient's questions were answered with full understanding. Strict return precautions discussed. Patient expresses understanding and agreement to plan.     Orthostatic VS for the past 24 hrs:  BP- Lying Pulse- Lying BP- Sitting Pulse- Sitting BP- Standing at 0 minutes Pulse- Standing at 0 minutes  07/12/17 0706 117/76 55 131/87 56 123/84 59    Final Clinical Impressions(s) / ED Diagnoses   Final diagnoses:  Chest pain, unspecified type  Vertigo    ED Discharge Orders        Ordered    meclizine (ANTIVERT) 25 MG tablet  3 times daily PRN     07/12/17 1043       Maxwell CaulLayden, Sarahlynn Cisnero A, PA-C 07/12/17 1059    Mesner, Barbara CowerJason, MD 07/13/17 2304

## 2017-07-12 NOTE — Discharge Instructions (Signed)
You can take Tylenol or Ibuprofen as directed for pain. You can alternate Tylenol and Ibuprofen every 4 hours. If you take Tylenol at 1pm, then you can take Ibuprofen at 5pm. Then you can take Tylenol again at 9pm.   Take meclizine for vertigo.  As we discussed, your sugars today were high in the ED.  This needs to be reevaluated by primary care doctor.  Please help with the referred [facility] to establish primary care and have your sugars reevaluated.  As we discussed, go up with referred cardiologist for further evaluation of your chest pain.  Return to the Emergency Department immediately if you experiencing worsening chest pain, difficulty breathing, nausea/vomiting, get very sweaty, headache or any other worsening or concerning symptoms.

## 2017-07-12 NOTE — ED Notes (Signed)
When cleaning room EMT noted patient had left his papers behind.  Took them to the lobby and patient not able to be located.  Attempted to all mobile number in the computer without an answer.

## 2017-07-12 NOTE — ED Notes (Signed)
Patient able to ambulate independently  

## 2017-07-12 NOTE — ED Triage Notes (Signed)
C/o intermittent sharp pain to L chest with feeling lightheaded since 4pm.  Denies sob, nausea, and vomiting.

## 2017-07-12 NOTE — ED Notes (Signed)
Pt ambulated, per PA request.  Patient c/o just a slight sensation of dizziness when initially standing.  Improves with walking.  States much better than previously.  Denies CP
